# Patient Record
Sex: Male | Born: 1965
Health system: Southern US, Community
[De-identification: ages and names within clinical notes are randomized; demographics above are authoritative.]

## PROBLEM LIST (undated history)

## (undated) DIAGNOSIS — M1990 Unspecified osteoarthritis, unspecified site: Secondary | ICD-10-CM

## (undated) HISTORY — PX: WISDOM TOOTH EXTRACTION: SHX21

## (undated) HISTORY — DX: Unspecified osteoarthritis, unspecified site: M19.90

---

## 2009-07-28 ENCOUNTER — Inpatient Hospital Stay (HOSPITAL_COMMUNITY): Admission: EM | Admit: 2009-07-28 | Discharge: 2009-07-30 | Payer: Self-pay

## 2010-05-08 LAB — POCT I-STAT, CHEM 8
Calcium, Ion: 1.14 mmol/L (ref 1.12–1.32)
Creatinine, Ser: 1.1 mg/dL (ref 0.4–1.5)
Glucose, Bld: 156 mg/dL — ABNORMAL HIGH (ref 70–99)
HCT: 43 % (ref 39.0–52.0)
Potassium: 3.5 meq/L (ref 3.5–5.1)
Sodium: 142 meq/L (ref 135–145)
TCO2: 25 mmol/L (ref 0–100)

## 2010-05-08 LAB — URINALYSIS, ROUTINE W REFLEX MICROSCOPIC
Glucose, UA: NEGATIVE mg/dL
Ketones, ur: NEGATIVE mg/dL
Leukocytes, UA: NEGATIVE
Nitrite: NEGATIVE
Protein, ur: NEGATIVE mg/dL

## 2010-05-08 LAB — CBC
HCT: 41.6 % (ref 39.0–52.0)
Hemoglobin: 14.1 g/dL (ref 13.0–17.0)
MCHC: 34.2 g/dL (ref 30.0–36.0)
MCV: 86.3 fL (ref 78.0–100.0)
RBC: 4.35 MIL/uL (ref 4.22–5.81)
RDW: 14 % (ref 11.5–15.5)

## 2010-05-08 LAB — BASIC METABOLIC PANEL
BUN: 5 mg/dL — ABNORMAL LOW (ref 6–23)
Calcium: 8.4 mg/dL (ref 8.4–10.5)
Creatinine, Ser: 0.97 mg/dL (ref 0.4–1.5)
Creatinine, Ser: 1.07 mg/dL (ref 0.4–1.5)
GFR calc Af Amer: >60 mL/min (ref >60)
GFR calc non Af Amer: >60 mL/min (ref >60)
Glucose, Bld: 102 mg/dL — ABNORMAL HIGH (ref 70–99)
Glucose, Bld: 105 mg/dL — ABNORMAL HIGH (ref 70–99)
Potassium: 4 mEq/L (ref 3.5–5.1)
Sodium: 140 mEq/L (ref 135–145)

## 2010-05-08 LAB — SAMPLE TO BLOOD BANK

## 2010-05-08 LAB — COMPREHENSIVE METABOLIC PANEL
ALT: 35 U/L (ref 0–53)
Alkaline Phosphatase: 66 U/L (ref 39–117)
BUN: 11 mg/dL (ref 6–23)
Calcium: 8.6 mg/dL (ref 8.4–10.5)
GFR calc non Af Amer: >60 mL/min (ref >60)
Potassium: 3.3 mEq/L — ABNORMAL LOW (ref 3.5–5.1)

## 2010-05-08 LAB — RAPID URINE DRUG SCREEN, HOSP PERFORMED
Cocaine: NOT DETECTED
Opiates: POSITIVE — AB

## 2010-05-08 LAB — POCT CARDIAC MARKERS
CKMB, poc: 2.3 ng/mL (ref 1.0–8.0)
Myoglobin, poc: 94.2 ng/mL (ref 12–200)
Troponin i, poc: <0.05 ng/mL (ref 0.00–0.09)

## 2010-05-08 LAB — DIFFERENTIAL
Basophils Absolute: 0 10*3/uL (ref 0.0–0.1)
Eosinophils Relative: 3 % (ref 0–5)
Lymphs Abs: 2.2 10*3/uL (ref 0.7–4.0)
Neutro Abs: 3.3 10*3/uL (ref 1.7–7.7)
Neutrophils Relative %: 54 % (ref 43–77)

## 2010-05-08 LAB — ETHANOL: Alcohol, Ethyl (B): <5 mg/dL (ref 0–10)

## 2010-05-08 LAB — PROTIME-INR: Prothrombin Time: 13.3 seconds (ref 11.6–15.2)
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
## 2014-06-30 ENCOUNTER — Encounter (HOSPITAL_BASED_OUTPATIENT_CLINIC_OR_DEPARTMENT_OTHER): Payer: BLUE CROSS/BLUE SHIELD

## 2014-07-01 ENCOUNTER — Encounter (HOSPITAL_BASED_OUTPATIENT_CLINIC_OR_DEPARTMENT_OTHER): Payer: BLUE CROSS/BLUE SHIELD

## 2019-04-07 ENCOUNTER — Other Ambulatory Visit: Payer: Self-pay

## 2019-04-07 ENCOUNTER — Ambulatory Visit (AMBULATORY_SURGERY_CENTER): Payer: BC Managed Care – PPO | Admitting: *Deleted

## 2019-04-07 VITALS — Temp 96.8°F | Ht 72.0 in | Wt 305.0 lb

## 2019-04-07 DIAGNOSIS — Z01818 Encounter for other preprocedural examination: Secondary | ICD-10-CM

## 2019-04-07 DIAGNOSIS — Z1211 Encounter for screening for malignant neoplasm of colon: Secondary | ICD-10-CM

## 2019-04-07 MED ORDER — SUTAB 1479-225-188 MG PO TABS: 1.0000 | ORAL_TABLET | ORAL | 0 refills | Status: DC

## 2019-04-07 NOTE — Progress Notes (Signed)
Patient is here in-person for PV. Patient denies any allergies to eggs or soy. Patient denies any problems with anesthesia/sedation. Patient denies any oxygen use at home. Patient denies taking any diet/weight loss medications or blood thinners. Patient is not being treated for MRSA or C-diff.   COVID-19 screening test is on 2/22, the pt is aware. Pt is aware that care partner will wait in the car during procedure; if they feel like they will be too hot or cold to wait in the car; they may wait in the 4 th floor lobby. Patient is aware to bring only one care partner. We want them to wear a mask (we do not have any that we can provide them), practice social distancing, and we will check their temperatures when they get here.  I did remind the patient that their care partner needs to stay in the parking lot the entire time and have a cell phone available, we will call them when the pt is ready for discharge. Patient will wear mask into building.    Sut[employer] coupon given to the patient.

## 2019-04-13 ENCOUNTER — Other Ambulatory Visit: Payer: Self-pay

## 2019-04-13 ENCOUNTER — Ambulatory Visit (INDEPENDENT_AMBULATORY_CARE_PROVIDER_SITE_OTHER): Payer: BC Managed Care – PPO

## 2019-04-13 ENCOUNTER — Encounter: Payer: Self-pay | Admitting: Gastroenterology

## 2019-04-13 DIAGNOSIS — Z1159 Encounter for screening for other viral diseases: Secondary | ICD-10-CM | POA: Diagnosis not present

## 2019-04-14 LAB — SARS CORONAVIRUS 2 (TAT 6-24 HRS): SARS Coronavirus 2: NEGATIVE

## 2019-04-15 ENCOUNTER — Ambulatory Visit (AMBULATORY_SURGERY_CENTER): Payer: BC Managed Care – PPO | Admitting: Gastroenterology

## 2019-04-15 ENCOUNTER — Encounter: Payer: Self-pay | Admitting: Gastroenterology

## 2019-04-15 ENCOUNTER — Other Ambulatory Visit: Payer: Self-pay

## 2019-04-15 VITALS — BP 97/54 | HR 68 | Temp 96.6°F | Resp 18 | Ht 72.0 in | Wt 305.0 lb

## 2019-04-15 DIAGNOSIS — Z1211 Encounter for screening for malignant neoplasm of colon: Secondary | ICD-10-CM | POA: Diagnosis not present

## 2019-04-15 DIAGNOSIS — D123 Benign neoplasm of transverse colon: Secondary | ICD-10-CM

## 2019-04-15 DIAGNOSIS — K635 Polyp of colon: Secondary | ICD-10-CM

## 2019-04-15 MED ORDER — SODIUM CHLORIDE 0.9 % IV SOLN: 500.0000 mL | Freq: Once | INTRAVENOUS | Status: DC

## 2019-04-15 NOTE — Op Note (Signed)
[facility] Patient Name: George Baker Procedure Date: 04/15/2019 8:49 AM MRN: [id number] Endoscopist: Remo Lipps P. Havery Moros , MD Age: 54 Referring MD:  Date of Birth: 04-06-1965 Gender: Male Account #: [id number] Procedure:                Colonoscopy Indications:              Screening for colorectal malignant neoplasm, This                            is the patient's first colonoscopy Medicines:                Monitored Anesthesia Care Procedure:                Pre-Anesthesia Assessment:                           - Prior to the procedure, a History and Physical                            was performed, and patient medications and                            allergies were reviewed. The patient's tolerance of                            previous anesthesia was also reviewed. The risks                            and benefits of the procedure and the sedation                            options and risks were discussed with the patient.                            All questions were answered, and informed consent                            was obtained. Prior Anticoagulants: The patient has                            taken no previous anticoagulant or antiplatelet                            agents. ASA Grade Assessment: II - A patient with                            mild systemic disease. After reviewing the risks                            and benefits, the patient was deemed in                            satisfactory condition to undergo the procedure.  After obtaining informed consent, the colonoscope                            was passed under direct vision. Throughout the                            procedure, the patient's blood pressure, pulse, and                            oxygen saturations were monitored continuously. The                            Colonoscope was introduced through the anus and                            advanced to the the  cecum, identified by                            appendiceal orifice and ileocecal valve. The                            colonoscopy was performed without difficulty. The                            patient tolerated the procedure well. The quality                            of the bowel preparation was good. The ileocecal                            valve, appendiceal orifice, and rectum were                            photographed. Scope In: 9:00:13 AM Scope Out: 9:22:21 AM Scope Withdrawal Time: 0 hours 17 minutes 20 seconds  Total Procedure Duration: 0 hours 22 minutes 8 seconds  Findings:                 The perianal and digital rectal examinations were                            normal.                           A 4 mm polyp was found in the transverse colon. The                            polyp was flat. The polyp was removed with a cold                            snare. Resection and retrieval were complete.                           Multiple medium-mouthed diverticula were found in  the entire colon, highest burden in the left colon,                            mild in transverse and right colon.                           Internal hemorrhoids were found during retroflexion.                           The colon was tortous. The exam was otherwise                            without abnormality. Complications:            No immediate complications. Estimated blood loss:                            Minimal. Estimated Blood Loss:     Estimated blood loss was minimal. Impression:               - One 4 mm polyp in the transverse colon, removed                            with a cold snare. Resected and retrieved.                           - Diverticulosis in the entire examined colon,                            highest burden in left colon.                           - Internal hemorrhoids.                           - The examination was otherwise  normal. Recommendation:           - Patient has a contact number available for                            emergencies. The signs and symptoms of potential                            delayed complications were discussed with the                            patient. Return to normal activities tomorrow.                            Written discharge instructions were provided to the                            patient.                           - Resume previous diet.                           -  Continue present medications.                           - Await pathology results. Remo Lipps P. Havery Moros, MD 04/15/2019 9:28:19 AM This report has been signed electronically.

## 2019-04-15 NOTE — Progress Notes (Signed)
Pt Drowsy. VSS. To PACU, report to RN. No anesthetic complications noted.  

## 2019-04-15 NOTE — Progress Notes (Signed)
Called to room to assist during endoscopic procedure.  Patient ID and intended procedure confirmed with present staff. Received instructions for my participation in the procedure from the performing physician.  

## 2019-04-15 NOTE — Patient Instructions (Signed)
YOU HAD AN ENDOSCOPIC PROCEDURE TODAY AT [facility]:   Refer to the procedure report that was given to you for any specific questions about what was found during the examination.  If the procedure report does not answer your questions, please call your gastroenterologist to clarify.  If you requested that your care partner not be given the details of your procedure findings, then the procedure report has been included in a sealed envelope for you to review at your convenience later.  YOU SHOULD EXPECT: Some feelings of bloating in the abdomen. Passage of more gas than usual.  Walking can help get rid of the air that was put into your GI tract during the procedure and reduce the bloating. If you had a lower endoscopy (such as a colonoscopy or flexible sigmoidoscopy) you may notice spotting of blood in your stool or on the toilet paper. If you underwent a bowel prep for your procedure, you may not have a normal bowel movement for a few days.  Please Note:  You might notice some irritation and congestion in your nose or some drainage.  This is from the oxygen used during your procedure.  There is no need for concern and it should clear up in a day or so.  SYMPTOMS TO REPORT IMMEDIATELY:   Following lower endoscopy (colonoscopy or flexible sigmoidoscopy):  Excessive amounts of blood in the stool  Significant tenderness or worsening of abdominal pains  Swelling of the abdomen that is new, acute  Fever of 100F or higher   Black, tarry-looking stools  For urgent or emergent issues, a gastroenterologist can be reached at any hour by calling [phone].   DIET:  We do recommend a small meal at first, but then you may proceed to your regular diet.  Drink plenty of fluids but you should avoid alcoholic beverages for 24 hours.  Try to increase the fiber in your diet, and drink plenty of water.  ACTIVITY:  You should plan to take it easy for the rest of today and you should NOT DRIVE  or use heavy machinery until tomorrow (because of the sedation medicines used during the test).    FOLLOW UP: Our staff will call the number listed on your records 48-72 hours following your procedure to check on you and address any questions or concerns that you may have regarding the information given to you following your procedure. If we do not reach you, we will leave a message.  We will attempt to reach you two times.  During this call, we will ask if you have developed any symptoms of COVID 19. If you develop any symptoms (ie: fever, flu-like symptoms, shortness of breath, cough etc.) before then, please call [phone].  If you test positive for Covid 19 in the 2 weeks post procedure, please call and report this information to [place].    If any biopsies were taken you will be contacted by phone or by letter within the next 1-3 weeks.  Please call us at [phone] if you have not heard about the biopsies in 3 weeks.    SIGNATURES/CONFIDENTIALITY: You and/or your care partner have signed paperwork which will be entered into your electronic medical record.  These signatures attest to the fact that that the information above on your After Visit Summary has been reviewed and is understood.  Full responsibility of the confidentiality of this discharge information lies with you and/or your care-partner.

## 2019-04-15 NOTE — Progress Notes (Signed)
Pt's states no medical or surgical changes since previsit or office visit.  Temp- June Vitals- Donna 

## 2019-04-17 ENCOUNTER — Encounter: Payer: Self-pay | Admitting: Gastroenterology

## 2019-04-17 ENCOUNTER — Telehealth: Payer: Self-pay | Admitting: *Deleted

## 2019-04-17 NOTE — Telephone Encounter (Signed)
  Follow up Call-  Call back number 04/15/2019  Post procedure Call Back phone  # [phone]  Permission to leave phone message Yes  Some recent data might be hidden     Patient questions:  Do you have a fever, pain , or abdominal swelling? No. Pain Score  0 *  Have you tolerated food without any problems? Yes.    Have you been able to return to your normal activities? Yes.    Do you have any questions about your discharge instructions: Diet   No. Medications  No. Follow up visit  No.  Do you have questions or concerns about your Care? No.  Actions: * If pain score is 4 or above: No action needed, pain <4.  1. Have you developed a fever since your procedure? no  2.   Have you had an respiratory symptoms (SOB or cough) since your procedure? no  3.   Have you tested positive for COVID 19 since your procedure no  4.   Have you had any family members/close contacts diagnosed with the COVID 19 since your procedure?  no   If yes to any of these questions please route to Joylene John, RN and Alphonsa Gin, Therapist, sports.

## 2019-05-21 DIAGNOSIS — Z0000 Encounter for general adult medical examination without abnormal findings: Secondary | ICD-10-CM | POA: Diagnosis not present

## 2019-05-21 DIAGNOSIS — Z125 Encounter for screening for malignant neoplasm of prostate: Secondary | ICD-10-CM | POA: Diagnosis not present

## 2019-05-21 DIAGNOSIS — Z8619 Personal history of other infectious and parasitic diseases: Secondary | ICD-10-CM | POA: Diagnosis not present

## 2019-05-29 DIAGNOSIS — R600 Localized edema: Secondary | ICD-10-CM | POA: Diagnosis not present

## 2019-05-29 DIAGNOSIS — R0602 Shortness of breath: Secondary | ICD-10-CM | POA: Diagnosis not present

## 2019-05-29 DIAGNOSIS — Z0000 Encounter for general adult medical examination without abnormal findings: Secondary | ICD-10-CM | POA: Diagnosis not present

## 2019-06-18 DIAGNOSIS — E6609 Other obesity due to excess calories: Secondary | ICD-10-CM | POA: Diagnosis not present

## 2019-06-18 DIAGNOSIS — R0602 Shortness of breath: Secondary | ICD-10-CM | POA: Diagnosis not present

## 2019-10-18 DIAGNOSIS — Z20822 Contact with and (suspected) exposure to covid-19: Secondary | ICD-10-CM | POA: Diagnosis not present

## 2020-01-18 DIAGNOSIS — D225 Melanocytic nevi of trunk: Secondary | ICD-10-CM | POA: Diagnosis not present

## 2020-01-18 DIAGNOSIS — Z1283 Encounter for screening for malignant neoplasm of skin: Secondary | ICD-10-CM | POA: Diagnosis not present

## 2020-01-18 DIAGNOSIS — B078 Other viral warts: Secondary | ICD-10-CM | POA: Diagnosis not present

## 2020-01-18 DIAGNOSIS — X32XXXA Exposure to sunlight, initial encounter: Secondary | ICD-10-CM | POA: Diagnosis not present

## 2020-01-18 DIAGNOSIS — L570 Actinic keratosis: Secondary | ICD-10-CM | POA: Diagnosis not present

## 2020-06-06 ENCOUNTER — Other Ambulatory Visit (HOSPITAL_COMMUNITY): Payer: Self-pay

## 2020-06-06 MED ORDER — MOLNUPIRAVIR 200 MG PO CAPS
800.0000 mg | ORAL_CAPSULE | Freq: Two times a day (BID) | ORAL | 0 refills | Status: AC
  Filled 2020-06-06: qty 40, 5d supply, fill #0

## 2020-06-06 MED ORDER — PSEUDOEPH-BROMPHEN-DM 30-2-10 MG/5ML PO SYRP
ORAL_SOLUTION | ORAL | 0 refills | Status: DC
  Filled 2020-06-06: qty 140, 9d supply, fill #0
  Filled 2020-06-06: qty 60, 4d supply, fill #0

## 2020-06-07 ENCOUNTER — Other Ambulatory Visit (HOSPITAL_COMMUNITY): Payer: Self-pay

## 2020-06-17 ENCOUNTER — Other Ambulatory Visit (HOSPITAL_COMMUNITY): Payer: Self-pay

## 2020-07-09 ENCOUNTER — Other Ambulatory Visit (HOSPITAL_COMMUNITY): Payer: Self-pay

## 2020-07-15 DIAGNOSIS — Z125 Encounter for screening for malignant neoplasm of prostate: Secondary | ICD-10-CM | POA: Diagnosis not present

## 2020-07-15 DIAGNOSIS — Z0000 Encounter for general adult medical examination without abnormal findings: Secondary | ICD-10-CM | POA: Diagnosis not present

## 2020-07-15 DIAGNOSIS — Z131 Encounter for screening for diabetes mellitus: Secondary | ICD-10-CM | POA: Diagnosis not present

## 2020-07-22 DIAGNOSIS — R600 Localized edema: Secondary | ICD-10-CM | POA: Diagnosis not present

## 2020-07-22 DIAGNOSIS — Z0000 Encounter for general adult medical examination without abnormal findings: Secondary | ICD-10-CM | POA: Diagnosis not present

## 2020-11-28 ENCOUNTER — Other Ambulatory Visit (HOSPITAL_BASED_OUTPATIENT_CLINIC_OR_DEPARTMENT_OTHER): Payer: Self-pay

## 2022-09-17 ENCOUNTER — Other Ambulatory Visit: Payer: Self-pay

## 2022-09-17 ENCOUNTER — Encounter: Payer: Self-pay | Admitting: Gastroenterology

## 2022-09-17 ENCOUNTER — Telehealth: Payer: Self-pay

## 2022-09-17 ENCOUNTER — Ambulatory Visit (INDEPENDENT_AMBULATORY_CARE_PROVIDER_SITE_OTHER): Payer: 59 | Admitting: Gastroenterology

## 2022-09-17 VITALS — BP 122/80 | HR 78 | Ht 72.0 in | Wt 241.0 lb

## 2022-09-17 DIAGNOSIS — K5909 Other constipation: Secondary | ICD-10-CM | POA: Diagnosis not present

## 2022-09-17 DIAGNOSIS — K6289 Other specified diseases of anus and rectum: Secondary | ICD-10-CM

## 2022-09-17 DIAGNOSIS — K610 Anal abscess: Secondary | ICD-10-CM | POA: Diagnosis not present

## 2022-09-17 MED ORDER — HYDROCORTISONE ACETATE 25 MG RE SUPP: 25.0000 mg | Freq: Two times a day (BID) | RECTAL | 0 refills | Status: AC

## 2022-09-17 MED ORDER — AMOXICILLIN-POT CLAVULANATE 500-125 MG PO TABS: 500.0000 mg | ORAL_TABLET | Freq: Two times a day (BID) | ORAL | 0 refills | Status: AC

## 2022-09-17 MED ORDER — DILTIAZEM GEL 2 %: 1.0000 | Freq: Two times a day (BID) | CUTANEOUS | 1 refills | Status: DC

## 2022-09-17 NOTE — Telephone Encounter (Signed)
Called patient, spoke to wife and explained to hold suppositories and that we are sending referral to [facility], patients wife verbalized understanding.

## 2022-09-17 NOTE — Patient Instructions (Addendum)
Start taking Miralax 1 capful (17 grams) 1x / day for 1 week.   If this is not effective, increase to 1 dose 2x / day for 1 week.   If this is still not effective, increase to two capfuls (34 grams) 2x / day.   Can adjust dose as needed based on response. Can take 1/2 cap daily, skip days, or increase per day.    Fiber daily  We have sent the following medications to your pharmacy for you to pick up at your convenience: Dilatiazem  Hydrocortisone suppositories Augmentin   You will be contacted by [facility] Scheduling in the next 2 days to arrange a Pelvic MRI .  The number on your caller ID will be [phone], please answer when they call.  If you have not heard from them in 2 days please call [phone] to schedule.     Due to recent changes in healthcare laws, you may see the results of your imaging and laboratory studies on MyChart before your provider has had a chance to review them.  We understand that in some cases there may be results that are confusing or concerning to you. Not all laboratory results come back in the same time frame and the provider may be waiting for multiple results in order to interpret others.  Please give [place] 48 hours in order for your provider to thoroughly review all the results before contacting the office for clarification of your results.    I appreciate the  opportunity to care for you  Thank You   [facility]ley [facility]

## 2022-09-17 NOTE — Progress Notes (Signed)
Agree with assessment with the following additional thoughts: - agree with empiric antibiotics if you suspect an abscess - if abscess is suspected would refer to [facility] colorectal surgery for further evaluation, if you can place urgent referral - agree with cross sectional imaging of the pelvic to assess for abscess / fistula - agree with diltiazem / lidocaine ointment trial in case component of fissure - would hold off on steroid suppository if you suspect abscess at this time
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
## 2022-09-19 ENCOUNTER — Ambulatory Visit (HOSPITAL_COMMUNITY)
Admission: RE | Admit: 2022-09-19 | Discharge: 2022-09-19 | Disposition: A | Discharge status: Home / Self Care | Payer: 59 | Source: Ambulatory Visit | Attending: Gastroenterology | Admitting: Gastroenterology

## 2022-09-19 DIAGNOSIS — K5909 Other constipation: Secondary | ICD-10-CM | POA: Insufficient documentation

## 2022-09-19 DIAGNOSIS — K6289 Other specified diseases of anus and rectum: Secondary | ICD-10-CM | POA: Diagnosis present

## 2022-09-19 MED ORDER — GADOBUTROL 1 MMOL/ML IV SOLN
10.0000 mL | Freq: Once | INTRAVENOUS | Status: AC | PRN
  Administered 2022-09-19: 10 mL via INTRAVENOUS

## 2024-03-18 ENCOUNTER — Telehealth: Payer: Self-pay | Admitting: Gastroenterology

## 2024-03-18 NOTE — Telephone Encounter (Signed)
 Incoming call from pt regarding Rx refill diltiazem  2 % GEL.  Pt stated he is out and needs refill. Pt requesting call for confirmation.  Please advise. Thank you

## 2024-03-18 NOTE — Telephone Encounter (Signed)
 Jan, This is an [facility] patient would he care if I refilled his Diltiazem  ointment? He hasn't been seen since 2024.

## 2024-03-18 NOTE — Telephone Encounter (Signed)
 Bayley   Will you approve  refill this Diltiazem  gel for the patient ? Thanks  See note

## 2024-03-18 NOTE — Telephone Encounter (Signed)
 Inbound call from patient stating he would like it sent to [facility] [address] [place], [place], [place] [address] and would like to be notified once medication is sent  Please advise  Thank you

## 2024-03-19 NOTE — Telephone Encounter (Signed)
 Called patient and wife picked up but was able to schedule him for a follow up on 04/24/24  Please advise  Thank you

## 2024-03-20 MED ORDER — DILTIAZEM GEL 2 %: 1.0000 | Freq: Two times a day (BID) | CUTANEOUS | 1 refills | Status: AC

## 2024-03-20 NOTE — Telephone Encounter (Signed)
 Printed and faxed prescription Diltiazem  Gel to [facility] and left a voicemail for the patient that the med (Diltiazem )Gel  was sent to [facility] by fax and he will need to keep his 3/6 appointment with [facility] for any further refills

## 2024-04-24 ENCOUNTER — Ambulatory Visit: Admitting: Gastroenterology
